# Patient Record
Sex: Female | Born: 1970 | Race: White | Hispanic: Yes | Marital: Married | Smoking: Never smoker
Health system: Southern US, Community
[De-identification: ages and names within clinical notes are randomized; demographics above are authoritative.]

## PROBLEM LIST (undated history)

## (undated) DIAGNOSIS — J45909 Unspecified asthma, uncomplicated: Secondary | ICD-10-CM

## (undated) HISTORY — DX: Unspecified asthma, uncomplicated: J45.909

---

## 1999-04-06 ENCOUNTER — Other Ambulatory Visit: Admission: RE | Admit: 1999-04-06 | Discharge: 1999-04-06 | Payer: Self-pay | Admitting: *Deleted

## 2000-08-19 ENCOUNTER — Emergency Department (HOSPITAL_COMMUNITY): Admission: EM | Admit: 2000-08-19 | Discharge: 2000-08-20 | Payer: Self-pay | Admitting: Emergency Medicine

## 2000-08-30 ENCOUNTER — Emergency Department (HOSPITAL_COMMUNITY): Admission: EM | Admit: 2000-08-30 | Discharge: 2000-08-30 | Payer: Self-pay | Admitting: Emergency Medicine

## 2000-10-06 ENCOUNTER — Other Ambulatory Visit: Admission: RE | Admit: 2000-10-06 | Discharge: 2000-10-06 | Payer: Self-pay | Admitting: *Deleted

## 2001-12-20 ENCOUNTER — Other Ambulatory Visit: Admission: RE | Admit: 2001-12-20 | Discharge: 2001-12-20 | Payer: Self-pay | Admitting: Obstetrics & Gynecology

## 2003-01-01 ENCOUNTER — Other Ambulatory Visit: Admission: RE | Admit: 2003-01-01 | Discharge: 2003-01-01 | Payer: Self-pay | Admitting: Obstetrics & Gynecology

## 2003-01-04 ENCOUNTER — Ambulatory Visit (HOSPITAL_COMMUNITY): Admission: RE | Admit: 2003-01-04 | Discharge: 2003-01-04 | Payer: Self-pay | Admitting: Obstetrics & Gynecology

## 2012-09-07 ENCOUNTER — Other Ambulatory Visit: Payer: Self-pay

## 2012-09-07 DIAGNOSIS — Z1231 Encounter for screening mammogram for malignant neoplasm of breast: Secondary | ICD-10-CM

## 2012-09-22 ENCOUNTER — Ambulatory Visit
Admission: RE | Admit: 2012-09-22 | Discharge: 2012-09-22 | Disposition: A | Discharge status: Home / Self Care | Payer: 59 | Source: Ambulatory Visit

## 2012-09-22 DIAGNOSIS — Z1231 Encounter for screening mammogram for malignant neoplasm of breast: Secondary | ICD-10-CM

## 2012-09-26 ENCOUNTER — Other Ambulatory Visit: Payer: Self-pay | Admitting: Obstetrics & Gynecology

## 2012-09-26 DIAGNOSIS — R928 Other abnormal and inconclusive findings on diagnostic imaging of breast: Secondary | ICD-10-CM

## 2012-11-01 ENCOUNTER — Ambulatory Visit
Admission: RE | Admit: 2012-11-01 | Discharge: 2012-11-01 | Disposition: A | Discharge status: Home / Self Care | Payer: 59 | Source: Ambulatory Visit | Attending: Obstetrics & Gynecology | Admitting: Obstetrics & Gynecology

## 2012-11-01 DIAGNOSIS — R928 Other abnormal and inconclusive findings on diagnostic imaging of breast: Secondary | ICD-10-CM

## 2014-12-10 ENCOUNTER — Encounter: Payer: Self-pay | Admitting: Obstetrics & Gynecology

## 2018-10-12 ENCOUNTER — Other Ambulatory Visit: Payer: Self-pay | Admitting: Obstetrics & Gynecology

## 2018-10-12 ENCOUNTER — Ambulatory Visit: Payer: BC Managed Care – PPO | Admitting: Obstetrics & Gynecology

## 2018-10-12 ENCOUNTER — Encounter: Payer: Self-pay | Admitting: Obstetrics & Gynecology

## 2018-10-12 ENCOUNTER — Other Ambulatory Visit: Payer: Self-pay

## 2018-10-12 VITALS — BP 124/82 | Ht 62.0 in | Wt 198.0 lb

## 2018-10-12 DIAGNOSIS — Z01419 Encounter for gynecological examination (general) (routine) without abnormal findings: Secondary | ICD-10-CM

## 2018-10-12 DIAGNOSIS — Z3009 Encounter for other general counseling and advice on contraception: Secondary | ICD-10-CM

## 2018-10-12 DIAGNOSIS — Z1231 Encounter for screening mammogram for malignant neoplasm of breast: Secondary | ICD-10-CM

## 2018-10-12 DIAGNOSIS — E6609 Other obesity due to excess calories: Secondary | ICD-10-CM

## 2018-10-12 DIAGNOSIS — Z6836 Body mass index (BMI) 36.0-36.9, adult: Secondary | ICD-10-CM

## 2018-10-12 DIAGNOSIS — Z23 Encounter for immunization: Secondary | ICD-10-CM

## 2018-10-12 NOTE — Progress Notes (Signed)
Laura Mcmahon January 11, 1971 [id number]   History:    48 y.o. 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  RP:  New (>3 yrs) patient presenting for annual gyn exam   HPI: Menses regular normal every month.  No BTB.  No pelvic pain.  No IC as husband has erectile problems, declines contraception.  Urine/BMs normal.  Breasts normal.  BMI 36.21.  Exercising with husband regularly.  Will decrease calories in diet.  Health labs with Dr Dagmar Hait.  Past medical history,surgical history, family history and social history were all reviewed and documented in the EPIC chart.  Gynecologic History Patient's last menstrual period was 10/04/2018. Contraception: abstinence Last Pap: 12/2014. Results were: Negative Last mammogram: 12/2014. Results were: Negative Bone Density: Never Colonoscopy: Never  Obstetric History OB History  Gravida Para Term Preterm AB Living  2 2       2   SAB TAB Ectopic Multiple Live Births        1      # Outcome Date GA Lbr Len/2nd Weight Sex Delivery Anes PTL Lv  2 Para           1 Para              ROS: A ROS was performed and pertinent positives and negatives are included in the history.  GENERAL: No fevers or chills. HEENT: No change in vision, no earache, sore throat or sinus congestion. NECK: No pain or stiffness. CARDIOVASCULAR: No chest pain or pressure. No palpitations. PULMONARY: No shortness of breath, cough or wheeze. GASTROINTESTINAL: No abdominal pain, nausea, vomiting or diarrhea, melena or bright red blood per rectum. GENITOURINARY: No urinary frequency, urgency, hesitancy or dysuria. MUSCULOSKELETAL: No joint or muscle pain, no back pain, no recent trauma. DERMATOLOGIC: No rash, no itching, no lesions. ENDOCRINE: No polyuria, polydipsia, no heat or cold intolerance. No recent change in weight. HEMATOLOGICAL: No anemia or easy bruising or bleeding. NEUROLOGIC: No headache, seizures, numbness, tingling or weakness. PSYCHIATRIC: No  depression, no loss of interest in normal activity or change in sleep pattern.     Exam:   BP 124/82   Ht 5\' 2"  (1.575 m)   Wt 198 lb (89.8 kg)   LMP 10/04/2018   BMI 36.21 kg/m   Body mass index is 36.21 kg/m.  General appearance : Well developed well nourished female. No acute distress HEENT: Eyes: no retinal hemorrhage or exudates,  Neck supple, trachea midline, no carotid bruits, no thyroidmegaly Lungs: Clear to auscultation, no rhonchi or wheezes, or rib retractions  Heart: Regular rate and rhythm, no murmurs or gallops Breast:Examined in sitting and supine position were symmetrical in appearance, no palpable masses or tenderness,  no skin retraction, no nipple inversion, no nipple discharge, no skin discoloration, no axillary or supraclavicular lymphadenopathy Abdomen: no palpable masses or tenderness, no rebound or guarding Extremities: no edema or skin discoloration or tenderness  Pelvic: Vulva: Normal             Vagina: No gross lesions or discharge  Cervix: No gross lesions or discharge.  Pap reflex done.  Uterus  AV, normal size, shape and consistency, non-tender and mobile  Adnexa  Without masses or tenderness  Anus: Normal   Assessment/Plan:  48 y.o. female for annual exam   1. Encounter for routine gynecological examination with Papanicolaou smear of cervix Normal gynecologic exam.  Pap reflex done.  Breast exam normal.  Screening mammogram to schedule now.  Health labs with Dr.  Avva.  2. Encounter for other general counseling or advice on contraception Declines contraception, rarely sexually active.  Condoms as needed.  3. Class 2 obesity due to excess calories without serious comorbidity with body mass index (BMI) of 36.0 to 36.9 in adult Recommend a lower calorie/carb diet such as [employer].  Aerobic physical activities 5 times a week and weightlifting every 2 days.  Other orders - Multiple Vitamin (MULTIVITAMIN) tablet; Take 1 tablet by mouth  daily. - Flu Vaccine QUAD 36+ mos IM (Fluarix, Quad PF)  [name] MD, 4:07 PM 10/12/2018

## 2018-10-13 LAB — PAP IG W/ RFLX HPV ASCU

## 2018-10-13 NOTE — Patient Instructions (Signed)
  1. Encounter for routine gynecological examination with Papanicolaou smear of cervix Normal gynecologic exam.  Pap reflex done.  Breast exam normal.  Screening mammogram to schedule now.  Health labs with Dr. Dagmar Hait.  2. Encounter for other general counseling or advice on contraception Declines contraception, rarely sexually active.  Condoms as needed.  3. Class 2 obesity due to excess calories without serious comorbidity with body mass index (BMI) of 36.0 to 36.9 in adult Recommend a lower calorie/carb diet such as [employer].  Aerobic physical activities 5 times a week and weightlifting every 2 days.  Other orders - Multiple Vitamin (MULTIVITAMIN) tablet; Take 1 tablet by mouth daily. - Flu Vaccine QUAD 36+ mos IM (Fluarix, Quad PF)  Verdis Frederickson, it was a pleasure seeing you today!  I will inform you of your results as soon as they are available.

## 2018-11-24 ENCOUNTER — Other Ambulatory Visit: Payer: Self-pay

## 2018-11-24 ENCOUNTER — Ambulatory Visit
Admission: RE | Admit: 2018-11-24 | Discharge: 2018-11-24 | Disposition: A | Discharge status: Home / Self Care | Payer: BC Managed Care – PPO | Source: Ambulatory Visit | Attending: Obstetrics & Gynecology | Admitting: Obstetrics & Gynecology

## 2018-11-24 DIAGNOSIS — Z1231 Encounter for screening mammogram for malignant neoplasm of breast: Secondary | ICD-10-CM

## 2019-10-16 ENCOUNTER — Other Ambulatory Visit: Payer: Self-pay

## 2019-10-16 ENCOUNTER — Ambulatory Visit (INDEPENDENT_AMBULATORY_CARE_PROVIDER_SITE_OTHER): Payer: BC Managed Care – PPO | Admitting: Obstetrics & Gynecology

## 2019-10-16 ENCOUNTER — Encounter: Payer: Self-pay | Admitting: Obstetrics & Gynecology

## 2019-10-16 VITALS — BP 126/84 | Ht 62.0 in | Wt 202.0 lb

## 2019-10-16 DIAGNOSIS — Z780 Asymptomatic menopausal state: Secondary | ICD-10-CM

## 2019-10-16 DIAGNOSIS — Z6836 Body mass index (BMI) 36.0-36.9, adult: Secondary | ICD-10-CM

## 2019-10-16 DIAGNOSIS — E6609 Other obesity due to excess calories: Secondary | ICD-10-CM

## 2019-10-16 DIAGNOSIS — Z01419 Encounter for gynecological examination (general) (routine) without abnormal findings: Secondary | ICD-10-CM | POA: Diagnosis not present

## 2019-10-16 DIAGNOSIS — Z3009 Encounter for other general counseling and advice on contraception: Secondary | ICD-10-CM | POA: Diagnosis not present

## 2019-10-16 NOTE — Progress Notes (Signed)
Laura Mcmahon 1970-09-05 [id number]   History:    49 y.o.  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  RP:  Established patient presenting for annual gyn exam   HPI:  Menopause with no menses x 03/2019.  Mild hot flushes.  Declines HRT at this time.  No PMB.  No pelvic pain.  No IC as husband has erectile problems, declines contraception.  Urine/BMs normal.  Breasts normal.  BMI 36.95.  Will decrease calories in diet.  Planning to resume physical activities.  Health labs with Dr Dagmar Hait.  Past medical history,surgical history, family history and social history were all reviewed and documented in the EPIC chart.  Gynecologic History Patient's last menstrual period was 03/18/2019.  Obstetric History OB History  Gravida Para Term Preterm AB Living  1 1       2   SAB TAB Ectopic Multiple Live Births        1      # Outcome Date GA Lbr Len/2nd Weight Sex Delivery Anes PTL Lv  1 Para              ROS: A ROS was performed and pertinent positives and negatives are included in the history.  GENERAL: No fevers or chills. HEENT: No change in vision, no earache, sore throat or sinus congestion. NECK: No pain or stiffness. CARDIOVASCULAR: No chest pain or pressure. No palpitations. PULMONARY: No shortness of breath, cough or wheeze. GASTROINTESTINAL: No abdominal pain, nausea, vomiting or diarrhea, melena or bright red blood per rectum. GENITOURINARY: No urinary frequency, urgency, hesitancy or dysuria. MUSCULOSKELETAL: No joint or muscle pain, no back pain, no recent trauma. DERMATOLOGIC: No rash, no itching, no lesions. ENDOCRINE: No polyuria, polydipsia, no heat or cold intolerance. No recent change in weight. HEMATOLOGICAL: No anemia or easy bruising or bleeding. NEUROLOGIC: No headache, seizures, numbness, tingling or weakness. PSYCHIATRIC: No depression, no loss of interest in normal activity or change in sleep pattern.     Exam:   BP 126/84   Ht 5\' 2"  (1.575  m)   Wt 202 lb (91.6 kg)   LMP 03/18/2019   BMI 36.95 kg/m   Body mass index is 36.95 kg/m.  General appearance : Well developed well nourished female. No acute distress HEENT: Eyes: no retinal hemorrhage or exudates,  Neck supple, trachea midline, no carotid bruits, no thyroidmegaly Lungs: Clear to auscultation, no rhonchi or wheezes, or rib retractions  Heart: Regular rate and rhythm, no murmurs or gallops Breast:Examined in sitting and supine position were symmetrical in appearance, no palpable masses or tenderness,  no skin retraction, no nipple inversion, no nipple discharge, no skin discoloration, no axillary or supraclavicular lymphadenopathy Abdomen: no palpable masses or tenderness, no rebound or guarding Extremities: no edema or skin discoloration or tenderness  Pelvic: Vulva: Normal             Vagina: No gross lesions or discharge  Cervix: No gross lesions or discharge.  Pap reflex done.  Uterus AV, normal size, shape and consistency, non-tender and mobile  Adnexa  Without masses or tenderness  Anus: Normal   Assessment/Plan:  49 y.o. female for annual exam   1. Encounter for routine gynecological examination with Papanicolaou smear of cervix Normal gynecologic exam.  Pap reflex done.  Breast exam normal.  Screening mammogram October 2020 was negative.  Has an appointment with gastro in November 2021 for screening colonoscopy.  Health labs with Dr. Dagmar Hait.  2. Menopause No menses since  February 2021.  Mild hot flushes.  Will call to organize a visit if wants to start HRT.  Vit D supplements, Ca++ 1200-1500 mg daily, regular weight bearing physical activities recommended.  3. Encounter for other general counseling or advice on contraception Abstinent.  4. Class 2 obesity due to excess calories without serious comorbidity with body mass index (BMI) of 36.0 to 36.9 in adult Low Calorie/Carb diet.  Aerobic activities 5 times a week, light weight lifting every 2  days.  Other orders - Ascorbic Acid (VITAMIN C) 100 MG tablet; Take 100 mg by mouth daily.  [name] MD, 4:24 PM 10/16/2019

## 2019-10-17 NOTE — Addendum Note (Signed)
Addended by: Thurnell Garbe A on: 10/17/2019 08:08 AM   Modules accepted: Orders

## 2019-10-18 LAB — PAP IG W/ RFLX HPV ASCU

## 2020-03-10 ENCOUNTER — Telehealth: Payer: Self-pay

## 2020-03-10 NOTE — Telephone Encounter (Signed)
Called in voice mail stating it has been one year since LMP.  Started bleeding 4 weeks ago and continues.  I asked the appointment desk to call her and schedule visit with Dr. Marguerita Merles as soon as possible.

## 2020-03-12 ENCOUNTER — Ambulatory Visit: Payer: BC Managed Care – PPO | Admitting: Obstetrics & Gynecology

## 2020-04-09 ENCOUNTER — Ambulatory Visit: Payer: BC Managed Care – PPO | Admitting: Obstetrics & Gynecology

## 2020-04-09 ENCOUNTER — Other Ambulatory Visit: Payer: Self-pay

## 2020-04-09 ENCOUNTER — Encounter: Payer: Self-pay | Admitting: Obstetrics & Gynecology

## 2020-04-09 VITALS — BP 124/86

## 2020-04-09 DIAGNOSIS — N950 Postmenopausal bleeding: Secondary | ICD-10-CM

## 2020-04-09 NOTE — Progress Notes (Signed)
    Laura Mcmahon Nov 15, 1970 [id number]        49 y.o.  G1P1L2  RP: Menometrorrhagia x 01/2020  HPI: Menometrorrhagia from last week of 01/2020 to first week of 03/2020.  Mild cramping while bleeding.  No abnormal vaginal discharge.  No UTI Sx.  BMs normal.  No fever.   OB History  Gravida Para Term Preterm AB Living  1 1       2   SAB IAB Ectopic Multiple Live Births        1      # Outcome Date GA Lbr Len/2nd Weight Sex Delivery Anes PTL Lv  1 Para             Past medical history,surgical history, problem list, medications, allergies, family history and social history were all reviewed and documented in the EPIC chart.   Directed ROS with pertinent positives and negatives documented in the history of present illness/assessment and plan.  Exam:  Vitals:   04/09/20 1503  BP: 124/86   General appearance:  Normal  Abdomen: Normal  Gynecologic exam: Vulva normal.  Speculum:  Cervix/Vagina normal.  No blood.  Bimanual exam:  AV uterus, normal volume, NT, mobile.  No adnexal mass, NT.   Assessment/Plan:  50 y.o. G1P1   1. Postmenopausal bleeding Prolonged PMB.  Normal gynecologic exam today.  Will further investigate with a Pelvic US at f/u.  - US Transvaginal Non-OB; Future  Other orders - Fexofenadine HCl (ALLERGY 24-HR PO); Take by mouth.  [name] MD, 3:31 PM 04/09/2020

## 2020-04-14 ENCOUNTER — Encounter: Payer: Self-pay | Admitting: Obstetrics & Gynecology

## 2020-05-15 ENCOUNTER — Encounter: Payer: Self-pay | Admitting: Obstetrics & Gynecology

## 2020-05-15 ENCOUNTER — Other Ambulatory Visit: Payer: Self-pay

## 2020-05-15 ENCOUNTER — Ambulatory Visit (INDEPENDENT_AMBULATORY_CARE_PROVIDER_SITE_OTHER): Payer: BC Managed Care – PPO

## 2020-05-15 ENCOUNTER — Ambulatory Visit: Payer: BC Managed Care – PPO | Admitting: Obstetrics & Gynecology

## 2020-05-15 VITALS — BP 128/72 | HR 82

## 2020-05-15 DIAGNOSIS — D250 Submucous leiomyoma of uterus: Secondary | ICD-10-CM

## 2020-05-15 DIAGNOSIS — N950 Postmenopausal bleeding: Secondary | ICD-10-CM | POA: Diagnosis not present

## 2020-05-15 DIAGNOSIS — R9389 Abnormal findings on diagnostic imaging of other specified body structures: Secondary | ICD-10-CM | POA: Diagnosis not present

## 2020-05-15 NOTE — Progress Notes (Signed)
    Laura Mcmahon 1970/07/01 [id number]        50 y.o.  G1P1   RP: PMB for Pelvic US  HPI: Menometrorrhagia from last week of 01/2020 to first week of 03/2020.  Mild cramping while bleeding.  No abnormal vaginal discharge.  No UTI Sx.  BMs normal.  No fever.   OB History  Gravida Para Term Preterm AB Living  1 1       2   SAB IAB Ectopic Multiple Live Births        1      # Outcome Date GA Lbr Len/2nd Weight Sex Delivery Anes PTL Lv  1 Para             Past medical history,surgical history, problem list, medications, allergies, family history and social history were all reviewed and documented in the EPIC chart.   Directed ROS with pertinent positives and negatives documented in the history of present illness/assessment and plan.  Exam:  Vitals:   05/15/20 1203  BP: 128/72  Pulse: 82   General appearance:  Normal  Pelvic US today: T/V images.  Retroverted uterus normal in size and shape measured at 8.52 x 4.87 x 4.01 cm.  Small amount of fluid at the cervix with debris's.  Myometrial mass measured at 1.7 x 1.3 cm compatible with an intramural fibroid.  Thickened endometrium with probable encroachment of the small fibroid.  Scant free fluid in the endometrial cavity.  The endometrial lining is measured at 6.48 mm.  Both ovaries are normal in size with normal follicular pattern.  No adnexal mass.  No free fluid in the posterior cul-de-sac.   Assessment/Plan:  50 y.o. G1P1   1. Postmenopausal bleeding Pelvic ultrasound findings thoroughly reviewed with patient.  Mildly thickened endometrial lining at 6.48 mm.  Probable submucosal component of the small fibroid.  Decision to proceed with a sonohysterogram.  2. Thickened endometrium As above.  Follow-up sonohysterogram. - [place] Sonohysterogram; Future  3. Submucous uterine fibroid As above. - [place] Sonohysterogram; Future  [name] MD, 12:08 PM 05/15/2020

## 2020-06-17 ENCOUNTER — Other Ambulatory Visit (HOSPITAL_COMMUNITY)
Admission: RE | Admit: 2020-06-17 | Discharge: 2020-06-17 | Disposition: A | Discharge status: Home / Self Care | Payer: BC Managed Care – PPO | Source: Ambulatory Visit | Attending: Obstetrics & Gynecology | Admitting: Obstetrics & Gynecology

## 2020-06-17 ENCOUNTER — Ambulatory Visit: Payer: BC Managed Care – PPO | Admitting: Obstetrics & Gynecology

## 2020-06-17 ENCOUNTER — Ambulatory Visit (INDEPENDENT_AMBULATORY_CARE_PROVIDER_SITE_OTHER): Payer: BC Managed Care – PPO

## 2020-06-17 ENCOUNTER — Other Ambulatory Visit: Payer: Self-pay

## 2020-06-17 DIAGNOSIS — D250 Submucous leiomyoma of uterus: Secondary | ICD-10-CM | POA: Diagnosis not present

## 2020-06-17 DIAGNOSIS — R9389 Abnormal findings on diagnostic imaging of other specified body structures: Secondary | ICD-10-CM

## 2020-06-17 DIAGNOSIS — N950 Postmenopausal bleeding: Secondary | ICD-10-CM | POA: Diagnosis not present

## 2020-06-17 DIAGNOSIS — D251 Intramural leiomyoma of uterus: Secondary | ICD-10-CM | POA: Diagnosis not present

## 2020-06-17 MED ORDER — MEDROXYPROGESTERONE ACETATE 10 MG PO TABS: 10.0000 mg | ORAL_TABLET | Freq: Every day | ORAL | 4 refills | Status: DC

## 2020-06-17 NOTE — Progress Notes (Signed)
Laura Mcmahon Jun 18, 1970 [id number]        49 y.o.  G1P1   RP: Post menopausal bleeding with a thickened Endometrium for Sonohysterogram  HPI: Menometrorrhagia from 01/2020 to first week of 03/2020.  Had other episodes since then, but no current bleeding.Mild cramping while bleeding.  Pelvic US 05/15/2020 showed a thickened endometrium at 6.48 mm with a probable SM component of the Fibroid. No abnormal vaginal discharge. No UTI Sx. BMs normal. No fever.    OB History  Gravida Para Term Preterm AB Living  1 1       2   SAB IAB Ectopic Multiple Live Births        1      # Outcome Date GA Lbr Len/2nd Weight Sex Delivery Anes PTL Lv  1 Para             Past medical history,surgical history, problem list, medications, allergies, family history and social history were all reviewed and documented in the EPIC chart.   Directed ROS with pertinent positives and negatives documented in the history of present illness/assessment and plan.  Exam:  There were no vitals filed for this visit. General appearance:  Normal                                                                    Sono Infusion Hysterogram ( procedure note)   The initial transvaginal ultrasound demonstrated the following:  T/V images.  No latex allergy.  Anteverted uteruS with a 2.4 cm posterior intramural fibroid.  Small amount of fluid still seen in the endocervical canal.  The endometrial cavity is slightly fluid-filled prior to the sonohysterogram.  Both ovaries are normal in size with normal follicular pattern.  No adnexal mass.  No free fluid in the posterior cul-de-sac.  The speculum  was inserted and the cervix cleansed with Betadine solution after confirming that patient has no allergies.A small sonohysterography catheterwas utilized.  Insertion was facilitated with ring forceps, using a spear-like motion the catheter was inserted to the fundus of the uterus. The speculum is then removed carefully to  avoid dislodging the catheter. The catheter was flushed with sterile saline delete prior to insertion to rid it of small amounts of air.the sterile saline solution was infused into the uterine cavity as a vaginal ultrasound probe was then placed in the vagina for full visualization of the uterine cavity from a transvaginal approach. The following was noted:  The endometrial lining is measured at 8.13 mm.  No intra uterine lesion seen.  The posterior fibroid is intramural with no submucosal component.  An endometrial biopsy is done on all intra uterine surfaces before removing the cannula.  The specimen is sent to pathology.  The catheter was then removed after retrieving some of the saline from the intrauterine cavity. An endometrial biopsy was done on all endometrial surfaces before removing the intra-uterine canula. Patient tolerated procedure well. She had received a tablet of Aleve for discomfort.    Assessment/Plan:  50 y.o. G1P1   1. Postmenopausal bleeding Ultrasound and sono hysterogram findings thoroughly reviewed with patient.  The intra uterine cavity is normal with no submucosal fibroid or polyp, but the endometrial lining is mildly thickened at 8.13 mm.  Therefore the  decision was made to proceed with an endometrial biopsy.  Informed consent was signed before doing so and no complication occurred.  We will manage per endometrial biopsy results.  Verify menopausal status with an [facility] today.  As a precaution, decision to start on Provera 10 mg/tab 1 tablet daily for 10 days cyclically to produce a withdrawal bleeding at least every 3 months.  Benefits, risks and usage thoroughly reviewed with patient.  Prescription sent to pharmacy. [facility] - Surgical pathology( [place]/ [facility])  2. Thickened endometrium Verify menopausal status with an [facility] today.  Pending endometrial biopsy to rule out endometrial pathology such as hyperplasia and endometrial cancer. [facility] - Surgical pathology( [facility])  3. Intramural uterine fibroid Intramural fibroids with no submucosal component.  Other orders - medroxyPROGESTERone (PROVERA) 10 MG tablet; Take 1 tablet (10 mg total) by mouth daily for 10 days. Cyclic Provera x 10 days every month if no spontaneous menstrual period.  [name] MD, 4:09 PM 06/17/2020

## 2020-06-18 ENCOUNTER — Encounter: Payer: Self-pay | Admitting: Obstetrics & Gynecology

## 2020-06-19 LAB — SURGICAL PATHOLOGY

## 2020-06-23 ENCOUNTER — Encounter: Payer: Self-pay | Admitting: Anesthesiology

## 2020-10-16 ENCOUNTER — Other Ambulatory Visit: Payer: Self-pay

## 2020-10-16 ENCOUNTER — Encounter: Payer: Self-pay | Admitting: Obstetrics & Gynecology

## 2020-10-16 ENCOUNTER — Ambulatory Visit (INDEPENDENT_AMBULATORY_CARE_PROVIDER_SITE_OTHER): Payer: BC Managed Care – PPO | Admitting: Obstetrics & Gynecology

## 2020-10-16 VITALS — BP 114/70 | HR 70 | Resp 16 | Ht 62.25 in | Wt 200.0 lb

## 2020-10-16 DIAGNOSIS — Z01419 Encounter for gynecological examination (general) (routine) without abnormal findings: Secondary | ICD-10-CM | POA: Diagnosis not present

## 2020-10-16 DIAGNOSIS — N951 Menopausal and female climacteric states: Secondary | ICD-10-CM | POA: Diagnosis not present

## 2020-10-16 DIAGNOSIS — E6609 Other obesity due to excess calories: Secondary | ICD-10-CM | POA: Diagnosis not present

## 2020-10-16 DIAGNOSIS — Z6836 Body mass index (BMI) 36.0-36.9, adult: Secondary | ICD-10-CM | POA: Diagnosis not present

## 2020-10-16 DIAGNOSIS — E66812 Obesity, class 2: Secondary | ICD-10-CM

## 2020-10-16 MED ORDER — MEDROXYPROGESTERONE ACETATE 10 MG PO TABS: 10.0000 mg | ORAL_TABLET | Freq: Every day | ORAL | 4 refills | Status: DC

## 2020-10-16 NOTE — Progress Notes (Signed)
Laura Mcmahon [birth year] [id number]   History:    50 y.o. 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   RP:  Established patient presenting for annual gyn exam    HPI:  Perimenopause. Mild hot flushes.  Has monthly withdrawal bleeding on cyclic Provera.  EBx Benign in 06/2020.  No pelvic pain.  No IC as husband has erectile problems, declines contraception.  Urine/BMs normal.  Breasts normal.  BMI 36.29.  Will decrease calories in diet.  Planning to resume physical activities.  Health labs with Dr Dagmar Hait.  Under investigation for a dry cough.    Past medical history,surgical history, family history and social history were all reviewed and documented in the EPIC chart.  Gynecologic History No LMP recorded (exact date). Patient is postmenopausal.  Obstetric History OB History  Gravida Para Term Preterm AB Living  '1 1       2  '$ SAB IAB Ectopic Multiple Live Births        1      # Outcome Date GA Lbr Len/2nd Weight Sex Delivery Anes PTL Lv  1 Para              ROS: A ROS was performed and pertinent positives and negatives are included in the history.  GENERAL: No fevers or chills. HEENT: No change in vision, no earache, sore throat or sinus congestion. NECK: No pain or stiffness. CARDIOVASCULAR: No chest pain or pressure. No palpitations. PULMONARY: No shortness of breath, cough or wheeze. GASTROINTESTINAL: No abdominal pain, nausea, vomiting or diarrhea, melena or bright red blood per rectum. GENITOURINARY: No urinary frequency, urgency, hesitancy or dysuria. MUSCULOSKELETAL: No joint or muscle pain, no back pain, no recent trauma. DERMATOLOGIC: No rash, no itching, no lesions. ENDOCRINE: No polyuria, polydipsia, no heat or cold intolerance. No recent change in weight. HEMATOLOGICAL: No anemia or easy bruising or bleeding. NEUROLOGIC: No headache, seizures, numbness, tingling or weakness. PSYCHIATRIC: No depression, no loss of interest in normal activity or change  in sleep pattern.     Exam:   BP 114/70   Pulse 70   Resp 16   Ht 5' 2.25" (1.581 m)   Wt 200 lb (90.7 kg)   LMP  (Exact Date) Comment: bleeding 08/2020  BMI 36.29 kg/m   Body mass index is 36.29 kg/m.  General appearance : Well developed well nourished female. No acute distress HEENT: Eyes: no retinal hemorrhage or exudates,  Neck supple, trachea midline, no carotid bruits, no thyroidmegaly Lungs: Clear to auscultation, no rhonchi or wheezes, or rib retractions  Heart: Regular rate and rhythm, no murmurs or gallops Breast:Examined in sitting and supine position were symmetrical in appearance, no palpable masses or tenderness,  no skin retraction, no nipple inversion, no nipple discharge, no skin discoloration, no axillary or supraclavicular lymphadenopathy Abdomen: no palpable masses or tenderness, no rebound or guarding Extremities: no edema or skin discoloration or tenderness  Pelvic: Vulva: Normal             Vagina: No gross lesions or discharge  Cervix: No gross lesions or discharge  Uterus  AV, normal size, shape and consistency, non-tender and mobile  Adnexa  Without masses or tenderness  Anus: Normal   Assessment/Plan:  50 y.o. female for annual exam   1. Well female exam with routine gynecological exam Normal gynecologic exam.  Pap reflex was - September 2021, will repeat at 2 to 3 years.  Breast exam normal.  Patient will schedule a  screening mammogram now.  Schedule colonoscopy.  Health labs with Dr. Dagmar Hait.  Cough under investigation.  2. Perimenopause Well on cyclic Provera 10 mg for 10 days every months.  Patient is having a light withdrawal bleeding post Provera every month.  Provera represcribed.  3. Class 2 obesity due to excess calories without serious comorbidity with body mass index (BMI) of 36.0 to 36.9 in adult Recommend a lower calorie/carb diet.  Recommend aerobic activities 5 times a week and light weightlifting every 2 days after investigating and  treating her new dry cough.  Other orders - albuterol (PROVENTIL) (2.5 MG/3ML) 0.083% nebulizer solution; SMARTSIG:1 Vial(s) Via Nebulizer Every 6-8 Hours PRN - albuterol (VENTOLIN HFA) 108 (90 Base) MCG/ACT inhaler; 2 puff as needed - famotidine (PEPCID) 20 MG tablet; Take 20 mg by mouth at bedtime as needed. - TRELEGY ELLIPTA 200-62.5-25 MCG/INH AEPB; Inhale 1 puff into the lungs daily. - montelukast (SINGULAIR) 10 MG tablet; Take 10 mg by mouth daily. - medroxyPROGESTERone (PROVERA) 10 MG tablet; Take 1 tablet (10 mg total) by mouth daily for 10 days. Cyclic Provera x 10 days every month if no spontaneous menstrual period.   [name] MD, 4:15 PM 10/16/2020

## 2020-10-29 NOTE — Progress Notes (Signed)
Synopsis: Referred for cough,, asthma by Prince Solian, MD  Subjective:   PATIENT ID: Laura Mcmahon GENDER: female DOB: Jun 28, 1970, MRN: [id number]  Chief Complaint  Patient presents with   Consult    Patient reports that she has some dry cough and she will feel the cough coming on at times.     89yF with history of asthma who is referred for cough, asthma. Had covid-19 in 03/2020.   She says that in late April/May had cough. Dry. Tried GERD medication (she can't remember which) for 30 days ( pepcid in chart). No difference. Then developed wheezing. No chest tightness. While cough is dry, feels like there is something moving up and down back of throat. She has no PND. She sometimes/pretty often has sinonasal congestion. She does think singulair has improved her sinonasal congestion.   Cough stopped in July and then restarted in August.   Did get prescription for steroid in June - this was very helpful for cough, trelegy has been helpful as well.  Otherwise pertinent review of systems is negative.  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 Hard for her to say whther her symptoms are worse at work. No pets or hot tub at home.   No past medical history on file.   Family History  Problem Relation Age of Onset   Asthma Neg Hx    Colon cancer Neg Hx    COPD Neg Hx    Deep vein thrombosis Neg Hx    Cystic fibrosis Neg Hx    Diabetes Neg Hx    Tuberous sclerosis Neg Hx    Prostate cancer Neg Hx    Neurofibromatosis Neg Hx      Past Surgical History:  Procedure Laterality Date   CESAREAN SECTION N/A 02/08/2006    Social History   Socioeconomic History   Marital status: Married    Spouse name: Not on file   Number of children: Not on file   Years of education: Not on file   Highest education level: Not on file  Occupational History   Not on file  Tobacco Use    Smoking status: Never   Smokeless tobacco: Never  Vaping Use   Vaping Use: Never used  Substance and Sexual Activity   Alcohol use: Not Currently   Drug use: Never   Sexual activity: Not Currently    Partners: Male    Birth control/protection: Post-menopausal  Other Topics Concern   Not on file  Social History Narrative   Not on file   Social Determinants of Health   Financial Resource Strain: Not on file  Food Insecurity: Not on file  Transportation Needs: Not on file  Physical Activity: Not on file  Stress: Not on file  Social Connections: Not on file  Intimate Partner Violence: Not on file     No Known Allergies   Outpatient Medications Prior to Visit  Medication Sig Dispense Refill   albuterol (PROVENTIL) (2.5 MG/3ML) 0.083% nebulizer solution SMARTSIG:1 Vial(s) Via Nebulizer Every 6-8 Hours PRN     albuterol (VENTOLIN HFA) 108 (90 Base) MCG/ACT inhaler 2 puff as needed     Ascorbic Acid (VITAMIN C) 100 MG tablet Take 100 mg by mouth daily.     famotidine (PEPCID) 20 MG tablet Take 20 mg by mouth at bedtime as needed.     medroxyPROGESTERone (PROVERA) 10 MG tablet Take 1 tablet (10  mg total) by mouth daily for 10 days. Cyclic Provera x 10 days every month if no spontaneous menstrual period. 30 tablet 4   montelukast (SINGULAIR) 10 MG tablet Take 10 mg by mouth daily.     Multiple Vitamin (MULTIVITAMIN) tablet Take 1 tablet by mouth daily.     TRELEGY ELLIPTA 200-62.5-25 MCG/INH AEPB Inhale 1 puff into the lungs daily.     No facility-administered medications prior to visit.       Objective:   Physical Exam:  General appearance: 50 y.o., female, NAD, conversant  Eyes: anicteric sclerae, moist conjunctivae; no lid-lag; PERRL, tracking appropriately HENT: NCAT; oropharynx, MMM, no mucosal ulcerations; normal hard and soft palate Neck: Trachea midline; no lymphadenopathy, no JVD Lungs: CTAB, no crackles, no wheeze, with normal respiratory effort CV: RRR, no MRGs   Abdomen: Soft, non-tender; non-distended, BS present  Extremities: No peripheral edema, radial and DP pulses present bilaterally  Skin: Normal temperature, turgor and texture; no rash Psych: Appropriate affect Neuro: Alert and oriented to person and place, no focal deficit    Vitals:   10/30/20 1533  BP: 110/72  Pulse: 85  Temp: 97.8 F (36.6 C)  TempSrc: Oral  SpO2: 97%  Weight: 201 lb 6.4 oz (91.4 kg)  Height: 5' 2.25" (1.581 m)   97% on RA BMI Readings from Last 3 Encounters:  10/30/20 36.54 kg/m  10/16/20 36.29 kg/m  10/16/19 36.95 kg/m   Wt Readings from Last 3 Encounters:  10/30/20 201 lb 6.4 oz (91.4 kg)  10/16/20 200 lb (90.7 kg)  10/16/19 202 lb (91.6 kg)     CBC No results found for: WBC, RBC, HGB, HCT, PLT, MCV, MCH, MCHC, RDW, LYMPHSABS, MONOABS, EOSABS, BASOSABS  None available  Chest Imaging: No cxr available for review - per pt's report nothing out of the ordinary this summer  Pulmonary Functions Testing Results: No flowsheet data found.      Assessment & Plan:   # Chronic cough Steroid responsiveness and good symptomatic response to trelegy increase suspicion for asthma. No overt GERD symptoms but only had trial of H2B in past it sounds like. No significant nasal congestion/PND at present with singulair. No throat tightness/hoarseness suggestive of VCD.  Plan: - obtain OSH CXR from this summer - cbc/diff, IgE - PFTs - come off trelegy, singulair for a week ideally before PFTs    I spent 46 minutes dedicated to the care of this patient on the date of this encounter to include pre-visit review of records, face-to-face time with the patient discussing conditions above, post visit ordering of testing, clinical documentation with the electronic health record, and communicating necessary findings to members of the patients care team.     Maryjane Hurter, MD [facility] 10/30/2020 3:56 PM

## 2020-10-30 ENCOUNTER — Encounter: Payer: Self-pay | Admitting: Student

## 2020-10-30 ENCOUNTER — Ambulatory Visit (INDEPENDENT_AMBULATORY_CARE_PROVIDER_SITE_OTHER): Payer: BC Managed Care – PPO | Admitting: Student

## 2020-10-30 ENCOUNTER — Other Ambulatory Visit: Payer: Self-pay

## 2020-10-30 VITALS — BP 110/72 | HR 85 | Temp 97.8°F | Ht 62.25 in | Wt 201.4 lb

## 2020-10-30 DIAGNOSIS — R053 Chronic cough: Secondary | ICD-10-CM

## 2020-10-30 DIAGNOSIS — U071 COVID-19: Secondary | ICD-10-CM | POA: Diagnosis not present

## 2020-10-30 MED ORDER — TRELEGY ELLIPTA 200-62.5-25 MCG/INH IN AEPB: 1.0000 | INHALATION_SPRAY | Freq: Every day | RESPIRATORY_TRACT | 6 refills | Status: DC

## 2020-10-30 NOTE — Patient Instructions (Signed)
-   breathing tests in 1-2 weeks. Try to stop trelegy and singulair for at least 2-3 days before breathing tests and ideally 1 week beforehand - labs today - i'll be in touch with results

## 2020-10-31 LAB — CBC WITH DIFFERENTIAL/PLATELET
Basophils Absolute: 0 10*3/uL (ref 0.0–0.1)
Basophils Relative: 0.5 % (ref 0.0–3.0)
Eosinophils Absolute: 0.1 10*3/uL (ref 0.0–0.7)
Eosinophils Relative: 1.8 % (ref 0.0–5.0)
HCT: 40.7 % (ref 36.0–46.0)
Hemoglobin: 13.7 g/dL (ref 12.0–15.0)
Lymphocytes Relative: 20 % (ref 12.0–46.0)
Lymphs Abs: 1.2 10*3/uL (ref 0.7–4.0)
MCHC: 33.8 g/dL (ref 30.0–36.0)
MCV: 90.3 fl (ref 78.0–100.0)
Monocytes Absolute: 0.6 10*3/uL (ref 0.1–1.0)
Monocytes Relative: 8.9 % (ref 3.0–12.0)
Neutro Abs: 4.3 10*3/uL (ref 1.4–7.7)
Neutrophils Relative %: 68.8 % (ref 43.0–77.0)
Platelets: 216 10*3/uL (ref 150.0–400.0)
RBC: 4.51 Mil/uL (ref 3.87–5.11)
RDW: 13.1 % (ref 11.5–15.5)
WBC: 6.2 10*3/uL (ref 4.0–10.5)

## 2020-10-31 LAB — IGE: IgE (Immunoglobulin E), Serum: 19 kU/L (ref <=114)

## 2020-11-17 ENCOUNTER — Other Ambulatory Visit: Payer: Self-pay

## 2020-11-17 ENCOUNTER — Ambulatory Visit (INDEPENDENT_AMBULATORY_CARE_PROVIDER_SITE_OTHER): Payer: BC Managed Care – PPO | Admitting: Student

## 2020-11-17 DIAGNOSIS — R053 Chronic cough: Secondary | ICD-10-CM | POA: Diagnosis not present

## 2020-11-17 LAB — PULMONARY FUNCTION TEST
DL/VA % pred: 127 %
DL/VA: 5.55 ml/min/mmHg/L
DLCO cor % pred: 144 %
DLCO cor: 28.64 ml/min/mmHg
DLCO unc % pred: 145 %
DLCO unc: 28.9 ml/min/mmHg
FEF 25-75 Post: 0.89 L/sec
FEF 25-75 Pre: 1.61 L/sec
FEF2575-%Change-Post: -44 %
FEF2575-%Pred-Post: 33 %
FEF2575-%Pred-Pre: 61 %
FEV1-%Change-Post: -23 %
FEV1-%Pred-Post: 62 %
FEV1-%Pred-Pre: 82 %
FEV1-Post: 1.66 L
FEV1-Pre: 2.17 L
FEV1FVC-%Change-Post: -12 %
FEV1FVC-%Pred-Pre: 79 %
FEV6-%Change-Post: -12 %
FEV6-%Pred-Post: 92 %
FEV6-%Pred-Pre: 105 %
FEV6-Post: 2.99 L
FEV6-Pre: 3.43 L
FEV6FVC-%Change-Post: 0 %
FEV6FVC-%Pred-Post: 102 %
FEV6FVC-%Pred-Pre: 102 %
FVC-%Change-Post: -12 %
FVC-%Pred-Post: 89 %
FVC-%Pred-Pre: 103 %
FVC-Post: 2.99 L
FVC-Pre: 3.43 L
Post FEV1/FVC ratio: 55 %
Post FEV6/FVC ratio: 100 %
Pre FEV1/FVC ratio: 63 %
Pre FEV6/FVC Ratio: 100 %
RV % pred: 109 %
RV: 1.89 L
TLC % pred: 114 %
TLC: 5.49 L

## 2020-11-17 NOTE — Patient Instructions (Signed)
Full PFT performed today. °

## 2020-11-17 NOTE — Progress Notes (Signed)
Full PFT performed today. °

## 2020-11-20 ENCOUNTER — Telehealth: Payer: Self-pay | Admitting: Student

## 2020-11-20 NOTE — Telephone Encounter (Signed)
Called and spoke with patient. She verbalized understanding. She will start back on the Trelegy tomorrow. Advised her to call us if she has any questions between now and her F/U.   Nothing further needed at time of call.

## 2020-11-20 NOTE — Telephone Encounter (Signed)
Can you let her know PFTs look suggestive of asthma (particularly if the pre/post bronchodilator results were accidentally reversed). She should resume her trelegy and then at next office visit we can reassess inhaler choice.  [place]

## 2021-01-12 NOTE — Progress Notes (Signed)
Synopsis: Referred for cough,, asthma by Prince Solian, MD  Subjective:   PATIENT ID: Laura Mcmahon GENDER: female DOB: 1970-12-15, MRN: [id number]  Chief Complaint  Patient presents with   Follow-up    Pt states she is doing better     87yF with history of asthma who is referred for cough, asthma. Had covid-19 in 03/2020.   She says that in late April/May had cough. Dry. Tried GERD medication (she can't remember which) for 30 days ( pepcid in chart). No difference. Then developed wheezing. No chest tightness. While cough is dry, feels like there is something moving up and down back of throat. She has no PND. She sometimes/pretty often has sinonasal congestion. She does think singulair has improved her sinonasal congestion.   Cough stopped in July and then restarted in August.   Did get prescription for steroid in June - this was very helpful for cough, trelegy has been helpful as well.  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 Hard for her to say whther her symptoms are worse at work. No pets or hot tub at home.   No family history of lung disease  Otherwise pertinent review of systems is negative.  Interval HPI: Overall doing better than at last visit. Still using trelegy 1 puff daily but on occasion notes palpitations/discomfort after use including last week. No courses of prednisone/ABX since last visit.   Has some active sinonasal congestion  She does think that she actually felt worse after breathing treatment during PFTs.    No past medical history on file.   Family History  Problem Relation Age of Onset   Asthma Neg Hx    Colon cancer Neg Hx    COPD Neg Hx    Deep vein thrombosis Neg Hx    Cystic fibrosis Neg Hx    Diabetes Neg Hx    Tuberous sclerosis Neg Hx    Prostate cancer Neg Hx    Neurofibromatosis Neg Hx      Past Surgical History:  Procedure Laterality Date   CESAREAN SECTION  N/A 02/08/2006    Social History   Socioeconomic History   Marital status: Married    Spouse name: Not on file   Number of children: Not on file   Years of education: Not on file   Highest education level: Not on file  Occupational History   Not on file  Tobacco Use   Smoking status: Never   Smokeless tobacco: Never  Vaping Use   Vaping Use: Never used  Substance and Sexual Activity   Alcohol use: Not Currently   Drug use: Never   Sexual activity: Not Currently    Partners: Male    Birth control/protection: Post-menopausal  Other Topics Concern   Not on file  Social History Narrative   Not on file   Social Determinants of Health   Financial Resource Strain: Not on file  Food Insecurity: Not on file  Transportation Needs: Not on file  Physical Activity: Not on file  Stress: Not on file  Social Connections: Not on file  Intimate Partner Violence: Not on file     No Known Allergies   Outpatient Medications Prior to Visit  Medication Sig Dispense Refill   Ascorbic Acid (VITAMIN C) 100 MG tablet Take 100 mg by mouth daily.     Multiple Vitamin (MULTIVITAMIN) tablet Take 1 tablet by mouth daily.     TRELEGY ELLIPTA 200-62.5-25 MCG/INH  AEPB Inhale 1 puff into the lungs daily. 1 each 6   albuterol (PROVENTIL) (2.5 MG/3ML) 0.083% nebulizer solution SMARTSIG:1 Vial(s) Via Nebulizer Every 6-8 Hours PRN (Patient not taking: Reported on 01/13/2021)     albuterol (VENTOLIN HFA) 108 (90 Base) MCG/ACT inhaler 2 puff as needed (Patient not taking: Reported on 01/13/2021)     famotidine (PEPCID) 20 MG tablet Take 20 mg by mouth at bedtime as needed. (Patient not taking: Reported on 01/13/2021)     medroxyPROGESTERone (PROVERA) 10 MG tablet Take 1 tablet (10 mg total) by mouth daily for 10 days. Cyclic Provera x 10 days every month if no spontaneous menstrual period. 30 tablet 4   montelukast (SINGULAIR) 10 MG tablet Take 10 mg by mouth daily. (Patient not taking: Reported on 01/13/2021)      No facility-administered medications prior to visit.       Objective:   Physical Exam:  General appearance: 50 y.o., female, NAD, conversant  Eyes: anicteric sclerae; PERRL, tracking appropriately HENT: NCAT; MMM Neck: Trachea midline; no lymphadenopathy, no JVD Lungs: CTAB, no crackles, no wheeze, with normal respiratory effort CV: RRR, no murmur  Abdomen: Soft, non-tender; non-distended, BS present  Extremities: No peripheral edema, warm Skin: Normal turgor and texture; no rash Psych: Appropriate affect Neuro: Alert and oriented to person and place, no focal deficit     Vitals:   01/13/21 1530  BP: 112/74  Pulse: 77  Temp: 97.8 F (36.6 C)  TempSrc: Oral  SpO2: 98%  Weight: 205 lb (93 kg)  Height: 5\' 3"  (1.6 m)    98% on RA BMI Readings from Last 3 Encounters:  01/13/21 36.31 kg/m  10/30/20 36.54 kg/m  10/16/20 36.29 kg/m   Wt Readings from Last 3 Encounters:  01/13/21 205 lb (93 kg)  10/30/20 201 lb 6.4 oz (91.4 kg)  10/16/20 200 lb (90.7 kg)     CBC    Component Value Date/Time   WBC 6.2 10/30/2020 1614   RBC 4.51 10/30/2020 1614   HGB 13.7 10/30/2020 1614   HCT 40.7 10/30/2020 1614   PLT 216.0 10/30/2020 1614   MCV 90.3 10/30/2020 1614   MCHC 33.8 10/30/2020 1614   RDW 13.1 10/30/2020 1614   LYMPHSABS 1.2 10/30/2020 1614   MONOABS 0.6 10/30/2020 1614   EOSABS 0.1 10/30/2020 1614   BASOSABS 0.0 10/30/2020 1614    None available  Chest Imaging: No cxr available for review - per pt's report nothing out of the ordinary this summer  Pulmonary Functions Testing Results: PFT Results Latest Ref Rng & Units 11/17/2020  FVC-Pre L 3.43  FVC-Predicted Pre % 103  FVC-Post L 2.99  FVC-Predicted Post % 89  Pre FEV1/FVC % % 63  Post FEV1/FCV % % 55  FEV1-Pre L 2.17  FEV1-Predicted Pre % 82  FEV1-Post L 1.66  DLCO uncorrected ml/min/mmHg 28.90  DLCO UNC% % 145  DLCO corrected ml/min/mmHg 28.64  DLCO COR %Predicted % 144  DLVA Predicted  % 127  TLC L 5.49  TLC % Predicted % 114  RV % Predicted % 109   Reviewed by me Mild obstruction, post BD PFTs may be inaccurate after discussion with PFT tech, DLCO elevated     Assessment & Plan:   # Chronic cough: # Mild intermittent asthma: Obstruction and elevated DLCO in this patient with steroid responsiveness and good symptomatic response to trelegy increase suspicion for asthma. Does have significant sinonasal congestion which is recurrent issue for her. No overt GERD symptoms but only  had trial of H2B in past it sounds like. No throat tightness/hoarseness suggestive of VCD.  Plan: - CXR today - can come off trelegy - start symbicort 80 2 puffs BID for 3-4 days at a time as needed only when doing worse - SMART inhaler strategy - start singulair 10 mg daily   RTC prn  I spent 37 minutes dedicated to the care of this patient on the date of this encounter to include pre-visit review of records, face-to-face time with the patient discussing conditions above, post visit ordering of testing, clinical documentation with the electronic health record, making appropriate referrals as documented, and communicating necessary findings to members of the patients care team.     Maryjane Hurter, MD [facility] 01/13/2021 4:31 PM

## 2021-01-13 ENCOUNTER — Ambulatory Visit: Payer: BC Managed Care – PPO | Admitting: Student

## 2021-01-13 ENCOUNTER — Encounter: Payer: Self-pay | Admitting: Student

## 2021-01-13 ENCOUNTER — Ambulatory Visit (INDEPENDENT_AMBULATORY_CARE_PROVIDER_SITE_OTHER): Payer: BC Managed Care – PPO

## 2021-01-13 ENCOUNTER — Other Ambulatory Visit: Payer: Self-pay

## 2021-01-13 VITALS — BP 112/74 | HR 77 | Temp 97.8°F | Ht 63.0 in | Wt 205.0 lb

## 2021-01-13 DIAGNOSIS — J4520 Mild intermittent asthma, uncomplicated: Secondary | ICD-10-CM | POA: Diagnosis not present

## 2021-01-13 DIAGNOSIS — R053 Chronic cough: Secondary | ICD-10-CM | POA: Diagnosis not present

## 2021-01-13 MED ORDER — MONTELUKAST SODIUM 10 MG PO TABS: 10.0000 mg | ORAL_TABLET | Freq: Every day | ORAL | 11 refills | Status: DC

## 2021-01-13 MED ORDER — BUDESONIDE-FORMOTEROL FUMARATE 80-4.5 MCG/ACT IN AERO: 2.0000 | INHALATION_SPRAY | Freq: Two times a day (BID) | RESPIRATORY_TRACT | 12 refills | Status: DC

## 2021-01-13 NOTE — Patient Instructions (Addendum)
-   Restart singulair 10 mg daily  - Can stop trelegy - I have prescribed symbicort 80 2 puffs twice daily as needed for 3-4 days at a time when you're feeling worse - If doing worse send a message and send message once you find out which inhaler will be cheapest as below:  Talk to your insurer about which if any of these following inhalers is best covered by your insurance. Let me know and I'd be glad to prescribe it.   LABA/ICS Inhaler Options:  GoodRx: AirDuo Respiclick $31-74 at [facility] Advair 100/250/500 HFA $61/74/133 (publix) at [facility] Symbicort 80/160 is about $90 at variety  AZ&Me: Symbicort can be provided at no cost  [place] patient assistance program: Advair and Breo. Must have spent $600 out-of-pocket on prescriptions this calendar year. If patient hasn't, they would not qualify for the program. Has to have local pharmacy print out how much is spent out of pocket to submit with application.

## 2021-04-28 ENCOUNTER — Other Ambulatory Visit: Payer: Self-pay | Admitting: Obstetrics & Gynecology

## 2021-04-28 DIAGNOSIS — Z1231 Encounter for screening mammogram for malignant neoplasm of breast: Secondary | ICD-10-CM

## 2021-04-29 ENCOUNTER — Ambulatory Visit
Admission: RE | Admit: 2021-04-29 | Discharge: 2021-04-29 | Disposition: A | Discharge status: Home / Self Care | Payer: BC Managed Care – PPO | Source: Ambulatory Visit | Attending: Obstetrics & Gynecology | Admitting: Obstetrics & Gynecology

## 2021-04-29 DIAGNOSIS — Z1231 Encounter for screening mammogram for malignant neoplasm of breast: Secondary | ICD-10-CM

## 2021-08-07 ENCOUNTER — Telehealth: Payer: Self-pay | Admitting: Student

## 2021-08-07 DIAGNOSIS — R911 Solitary pulmonary nodule: Secondary | ICD-10-CM

## 2021-08-07 NOTE — Telephone Encounter (Signed)
Will you reach out to Ms. Nova-Dancausse to let her know I've ordered a CT of her chest? I'd also like to set up a clinic visit with her in a couple weeks to review the results if she's ok with it. X ray at our last visit showed a small nodule - she has a low risk of lung cancer based on her history but out of abundance of caution I have ordered CT Chest. Tried calling and left voicemail.

## 2021-08-10 NOTE — Telephone Encounter (Signed)
Called an spoke with patient. Patient verbalized understanding. Nothing further needed.

## 2021-08-18 ENCOUNTER — Ambulatory Visit
Admission: RE | Admit: 2021-08-18 | Discharge: 2021-08-18 | Disposition: A | Discharge status: Home / Self Care | Payer: BC Managed Care – PPO | Source: Ambulatory Visit | Attending: Student | Admitting: Student

## 2021-08-18 DIAGNOSIS — R911 Solitary pulmonary nodule: Secondary | ICD-10-CM

## 2021-08-24 ENCOUNTER — Telehealth: Payer: Self-pay | Admitting: Student

## 2021-08-24 DIAGNOSIS — R590 Localized enlarged lymph nodes: Secondary | ICD-10-CM

## 2021-08-25 NOTE — Telephone Encounter (Signed)
Called and spoke with patient. She is aware that I will send a message over to Dr. Verlee Monte for the results of her CT scan from 07/11.   Dr. Verlee Monte, can you please advise on her CT scan results? Thanks!

## 2021-08-26 NOTE — Telephone Encounter (Signed)
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 We will repeat ct chest in 6 months to follow anterior mediastinal LAD. Can we schedule clinic visit with me in 7 months?

## 2021-08-26 NOTE — Telephone Encounter (Signed)
Noted  

## 2021-08-26 NOTE — Telephone Encounter (Signed)
Recall placed for 7 months in clinic- routing to [facility] as FYI for CT scan in 6 months.

## 2021-10-20 ENCOUNTER — Ambulatory Visit (INDEPENDENT_AMBULATORY_CARE_PROVIDER_SITE_OTHER): Payer: BC Managed Care – PPO | Admitting: Obstetrics & Gynecology

## 2021-10-20 ENCOUNTER — Encounter: Payer: Self-pay | Admitting: Obstetrics & Gynecology

## 2021-10-20 VITALS — BP 110/74 | HR 91 | Ht 62.25 in | Wt 187.0 lb

## 2021-10-20 DIAGNOSIS — Z01419 Encounter for gynecological examination (general) (routine) without abnormal findings: Secondary | ICD-10-CM

## 2021-10-20 DIAGNOSIS — N951 Menopausal and female climacteric states: Secondary | ICD-10-CM

## 2021-10-20 NOTE — Progress Notes (Signed)
Laura Mcmahon 1970-09-04 [id number]   History:    51 y.o. G1P1L2 Married.  First grade teacher. Twin son/daughter 73 yo.   RP:  Established patient presenting for annual gyn exam    HPI:  Postmenopause on no HRT. No PMB.  Severe hot flushes and night sweats.  No pelvic pain.  No IC as husband has erectile problems, declines contraception.  Pap Neg 10/2019.  No h/o abnormal Pap.  Will repeat Pap at 2 yrs next year.  Urine/BMs normal.  Cologuard 2022. Breasts normal.  Mammo Neg 04/2021.  BMI improved to 33.93.  Continue with low calorie/carb diet. Regular physical activities.  Health labs with Dr Dagmar Hait.    Past medical history,surgical history, family history and social history were all reviewed and documented in the EPIC chart.  Gynecologic History Patient's last menstrual period was 03/18/2019.  Obstetric History OB History  Gravida Para Term Preterm AB Living  '1 1 1     2  '$ SAB IAB Ectopic Multiple Live Births        1      # Outcome Date GA Lbr Len/2nd Weight Sex Delivery Anes PTL Lv  1A Term           1B Term              ROS: A ROS was performed and pertinent positives and negatives are included in the history.  GENERAL: No fevers or chills. HEENT: No change in vision, no earache, sore throat or sinus congestion. NECK: No pain or stiffness. CARDIOVASCULAR: No chest pain or pressure. No palpitations. PULMONARY: No shortness of breath, cough or wheeze. GASTROINTESTINAL: No abdominal pain, nausea, vomiting or diarrhea, melena or bright red blood per rectum. GENITOURINARY: No urinary frequency, urgency, hesitancy or dysuria. MUSCULOSKELETAL: No joint or muscle pain, no back pain, no recent trauma. DERMATOLOGIC: No rash, no itching, no lesions. ENDOCRINE: No polyuria, polydipsia, no heat or cold intolerance. No recent change in weight. HEMATOLOGICAL: No anemia or easy bruising or bleeding. NEUROLOGIC: No headache, seizures, numbness, tingling or weakness. PSYCHIATRIC: No  depression, no loss of interest in normal activity or change in sleep pattern.     Exam:   BP 110/74   Pulse 91   Ht 5' 2.25" (1.581 m)   Wt 187 lb (84.8 kg)   LMP 03/18/2019   SpO2 97%   BMI 33.93 kg/m   Body mass index is 33.93 kg/m.  General appearance : Well developed well nourished female. No acute distress HEENT: Eyes: no retinal hemorrhage or exudates,  Neck supple, trachea midline, no carotid bruits, no thyroidmegaly Lungs: Clear to auscultation, no rhonchi or wheezes, or rib retractions  Heart: Regular rate and rhythm, no murmurs or gallops Breast:Examined in sitting and supine position were symmetrical in appearance, no palpable masses or tenderness,  no skin retraction, no nipple inversion, no nipple discharge, no skin discoloration, no axillary or supraclavicular lymphadenopathy Abdomen: no palpable masses or tenderness, no rebound or guarding Extremities: no edema or skin discoloration or tenderness  Pelvic: Vulva: Normal             Vagina: No gross lesions or discharge  Cervix: No gross lesions or discharge  Uterus  AV, normal size, shape and consistency, non-tender and mobile  Adnexa  Without masses or tenderness  Anus: Normal   Assessment/Plan:  51 y.o. female for annual exam   1. Well female exam with routine gynecological exam Postmenopause on no HRT. No PMB.  Severe hot  flushes and night sweats.  No pelvic pain.  No IC as husband has erectile problems, declines contraception.  Pap Neg 10/2019.  No h/o abnormal Pap.  Will repeat Pap at 2 yrs next year.  Urine/BMs normal.  Cologuard 2022. Breasts normal.  Mammo Neg 04/2021.  BMI improved to 33.93.  Continue with low calorie/carb diet. Regular physical activities.  Health labs with Dr Dagmar Hait.   2. Postmenopausal syndrome  Postmenopause on no HRT. No PMB.  Severe hot flushes and night sweats.  No pelvic pain.  No IC as husband has erectile problems, declines contraception. Will try Ashwagandha.  If not enough to  control her hot flushes and night sweats, will schedule a visit to consider HRT.  [name] MD, 3:40 PM 10/20/2021

## 2022-02-16 ENCOUNTER — Other Ambulatory Visit: Payer: BC Managed Care – PPO

## 2022-03-15 ENCOUNTER — Other Ambulatory Visit: Payer: Self-pay | Admitting: Student

## 2022-10-22 ENCOUNTER — Ambulatory Visit: Payer: BC Managed Care – PPO | Admitting: Obstetrics & Gynecology

## 2023-02-10 ENCOUNTER — Other Ambulatory Visit: Payer: Self-pay | Admitting: Obstetrics and Gynecology

## 2023-02-10 DIAGNOSIS — Z1231 Encounter for screening mammogram for malignant neoplasm of breast: Secondary | ICD-10-CM

## 2023-02-11 ENCOUNTER — Ambulatory Visit
Admission: RE | Admit: 2023-02-11 | Discharge: 2023-02-11 | Disposition: A | Discharge status: Home / Self Care | Payer: 59 | Source: Ambulatory Visit | Attending: Obstetrics and Gynecology

## 2023-02-11 DIAGNOSIS — Z1231 Encounter for screening mammogram for malignant neoplasm of breast: Secondary | ICD-10-CM

## 2023-09-16 ENCOUNTER — Ambulatory Visit: Payer: Self-pay

## 2023-09-16 NOTE — Telephone Encounter (Signed)
 FYI Only or Action Required?: FYI only for provider. - Advised call PCP or go to UC for appt today  Patient is followed in Pulmonology for chronic cough, last seen on 01/13/2021 by Gladis Leonor HERO, MD. - TOC previously scheduled for 9/30 with Dr. Theophilus Fortis Nurse Triage reporting Wheezing, worsening cough, Dizziness, chest tightness with coughing, SOB with coughing and exertion, and chest congestion.  Symptoms began about a month ago.  Interventions attempted: Maintenance inhaler.  Symptoms are: gradually worsening.  Triage Disposition: See HCP Within 4 Hours (Or PCP Triage)  Patient/caregiver understands and will follow disposition?: Yes     CRM did not go through. Agent reporting pt having SOB, wheezing, chest tightness.  Reason for Disposition  [1] MILD difficulty breathing (e.g., minimal/no SOB at rest, SOB with walking, pulse < 100) AND [2] NEW-onset or WORSE than normal  Answer Assessment - Initial Assessment Questions E2C2 Pulmonary Triage - Initial Assessment Questions Chief Complaint (e.g., cough, sob, wheezing, fever, chills, sweat or additional symptoms) *Go to specific symptom protocol after initial questions. Worse SOB than usual just with coughing, not constant, not really having SOB when not coughing Wheezing most of the time, goes away for little bit but after hour from taking breo feel it again Chest tightness, mainly because of the coughing, feels like congested in there, not actually pain-pain more like a tightness, hurts when cough a lot, not constant, comes and goes When cough most is when feel phlegm in chest, feel tightness and real SOB Making me cough a lot, voice kind of raspy, lot of coughing and lot of phlegm coming Breo seems to be controlling it, taking it for over year, but worsening symptoms for past month, symbicort  worked pretty well Some days this week feeling lightheaded Phlegm for most part very clear but some very very light brown, beige,  no red yellow black coming up Even if walking or going up stairs get more SOB lately SOB more with coughing Speaking in full sentences  How long have symptoms been present? past month or so been getting little bit worse and worse  Have you used your inhalers/maintenance medication? Yes If yes, What medications? Use inhaler in morning does help Recently changed from symbicort  to breo ellipta 1-1.5 years ago Breo x1/day usually in morning No rescue inhaler  OXYGEN: Do you wear supplemental oxygen? No  Do you monitor your oxygen levels? Yes If yes, What is your reading (oxygen level) today? 96%  What is your usual oxygen saturation reading?  (Note: Pulmonary O2 sats should be 90% or greater) 97-98%  6. CARDIAC HISTORY: Do you have any history of heart disease? (e.g., heart attack, angina, bypass surgery, angioplasty)      No hx of blood clots in legs or lungs, denies cardiac hx  7. LUNG HISTORY: Do you have any history of lung disease?  (e.g., pulmonary embolus, asthma, emphysema)     Chronic cough 2022 seen Dr. Gladis, stopped for a while, 2023-2025 pretty decent but for past few months cough been progressing, was not constant but lately been getting worse  9. OTHER SYMPTOMS: Do you have any other symptoms? (e.g., chest pain, cough, dizziness, fever, runny nose)     BP fine 101/68, first lightheadedness Monday or Tuesday and felt like about to pass out though sitting down but went away after several minutes, then next day around same time, not severe but not had any since then No fever   Advised pt be examined in next 4  hours, no pulm availability, agent scheduled TOC to Dr. Theophilus for September, last seen by Dr. Gladis December 2022. Advised pt call PCP for appt today, otherwise go to UC today. Advised call back if any worsening or new symptoms, go to ED if SOB even at rest or worsening chest pain, dizziness worsens again.  Protocols used: Breathing  Difficulty-A-AH

## 2023-09-18 IMAGING — MG MM DIGITAL SCREENING BILAT W/ TOMO AND CAD
8 series · 9 of 24 positions shown · non-contrast
Comparison: Previous exam(s).

CLINICAL DATA: Screening.

EXAM:
DIGITAL SCREENING BILATERAL MAMMOGRAM WITH TOMOSYNTHESIS AND CAD
TECHNIQUE: Bilateral screening digital craniocaudal and mediolateral oblique
mammograms were obtained. Bilateral screening digital breast
tomosynthesis was performed. The images were evaluated with
computer-aided detection.

[L CC synth-2D]
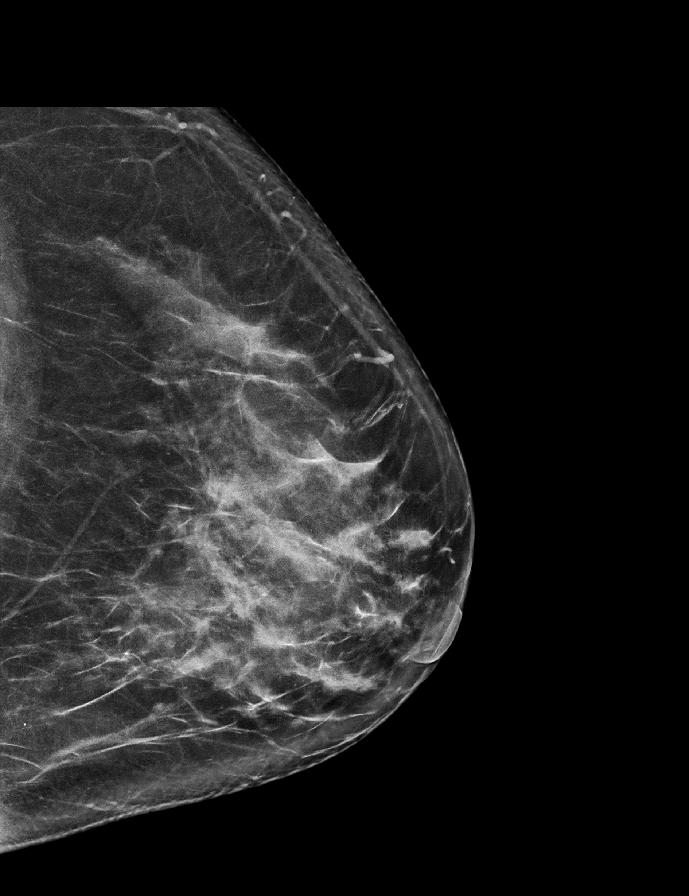

[R CC synth-2D]
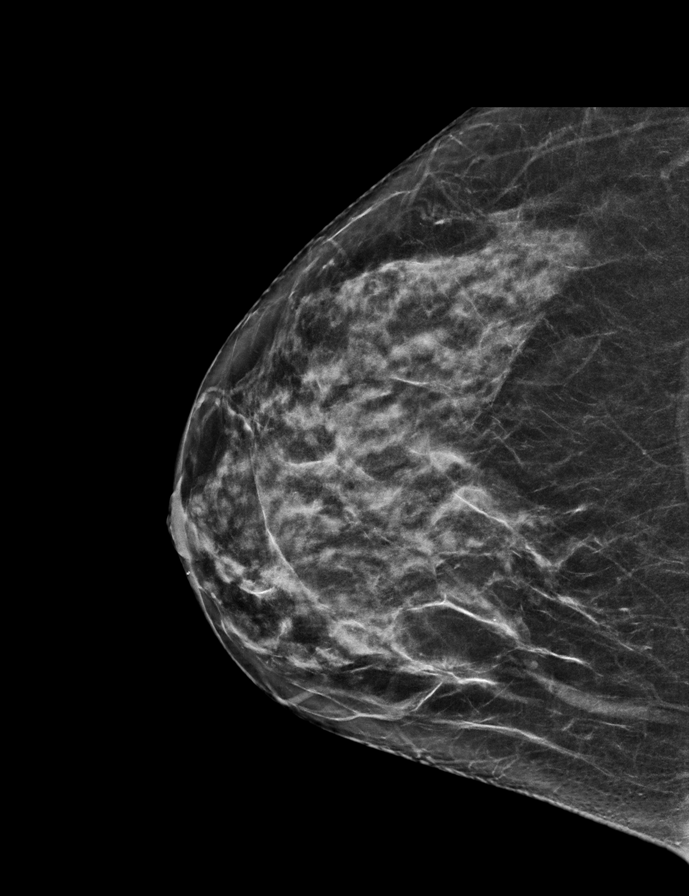

[R MLO synth-2D]
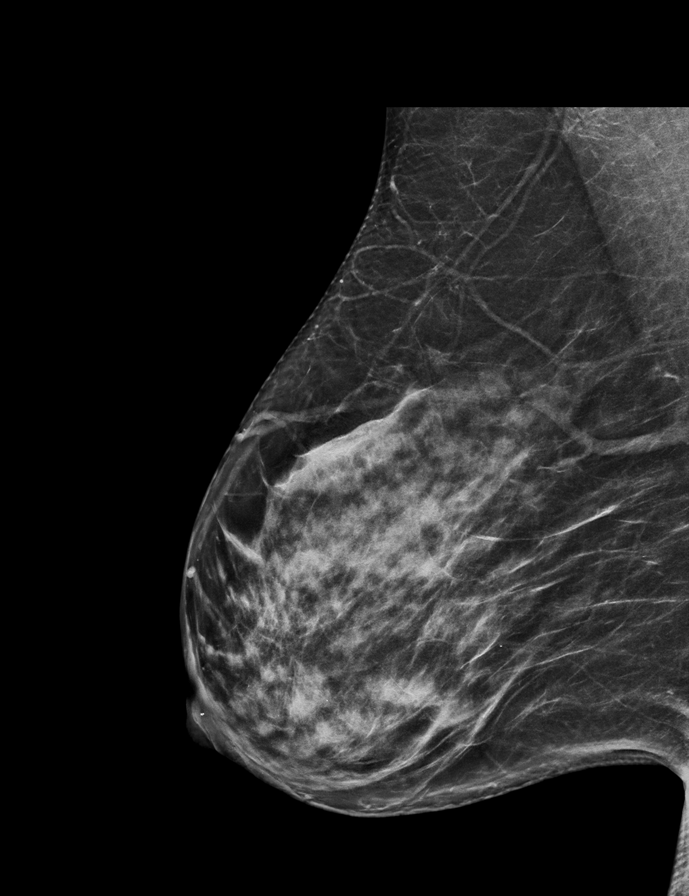

[L MLO synth-2D]
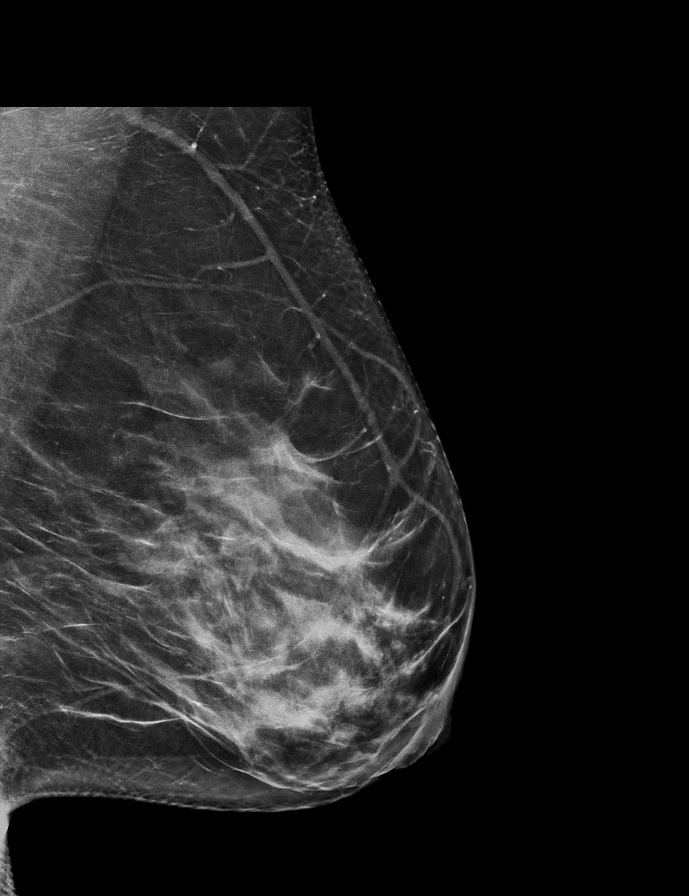

[L MLO tomo · 2 of 73 frames shown]
[frame 24/73]
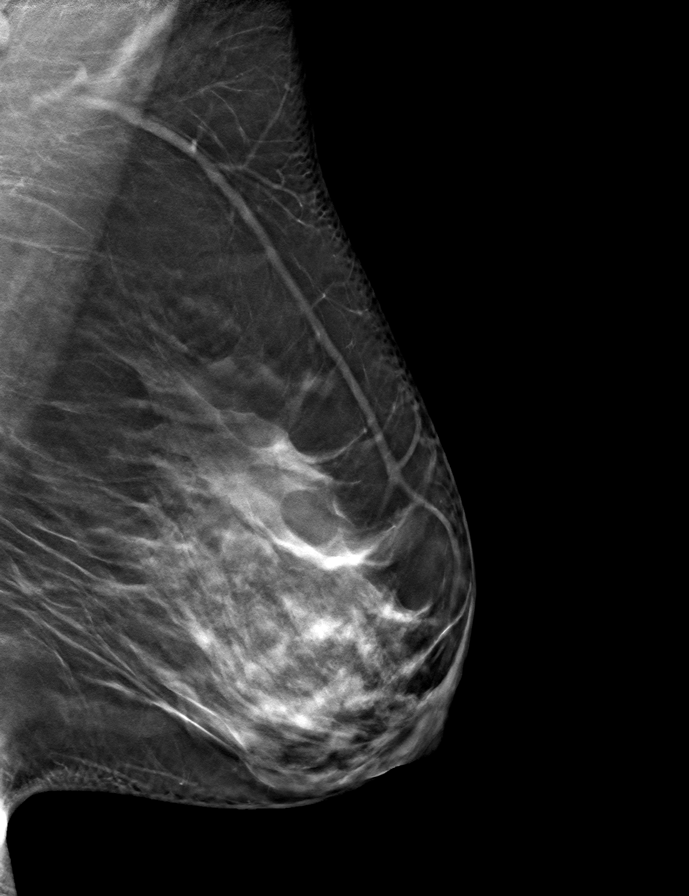
[frame 37/73]
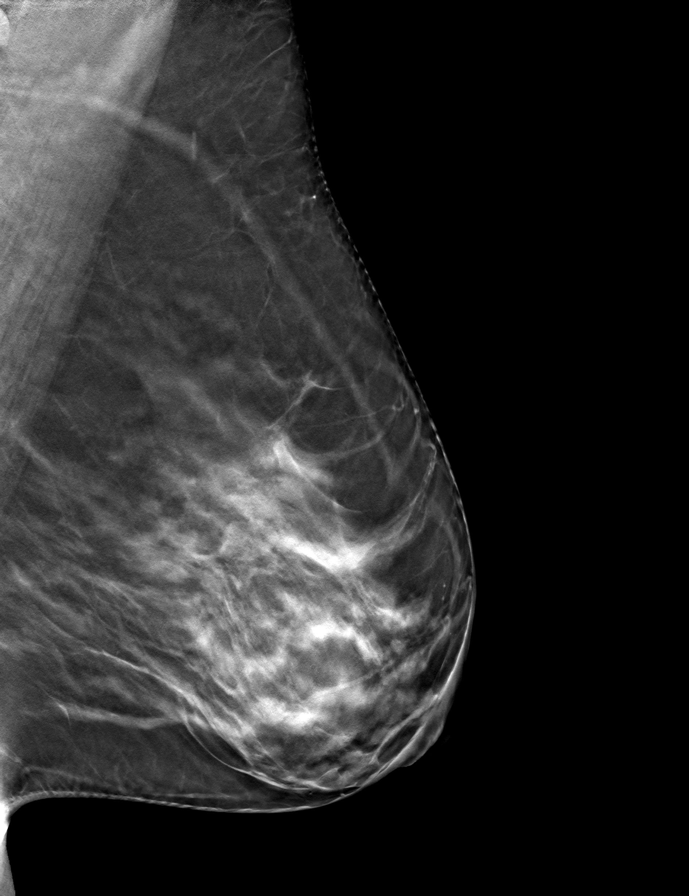

[R MLO tomo · tomo slice 34/67.0]
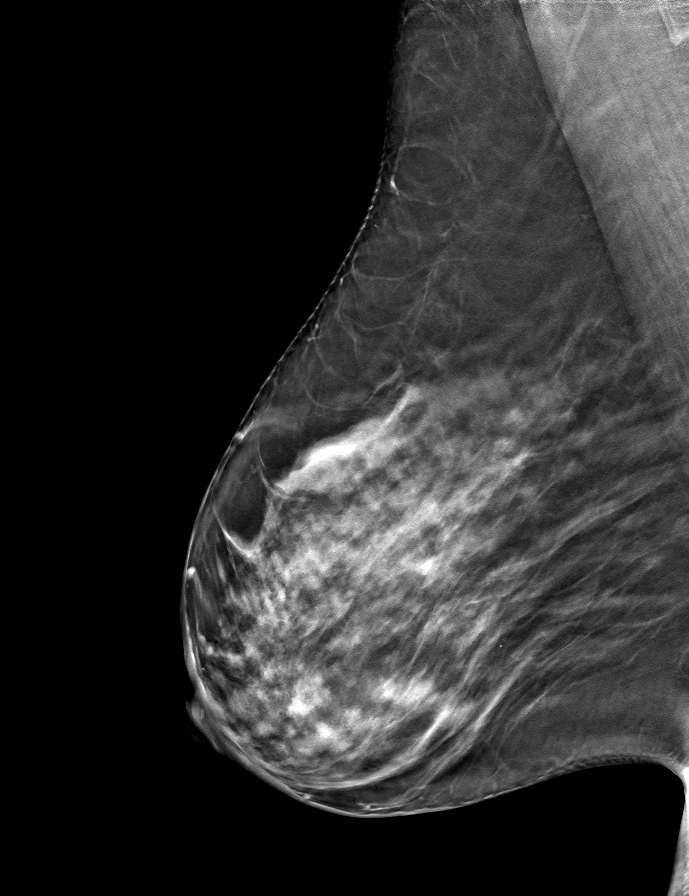

[L CC tomo · tomo slice 35/70.0]
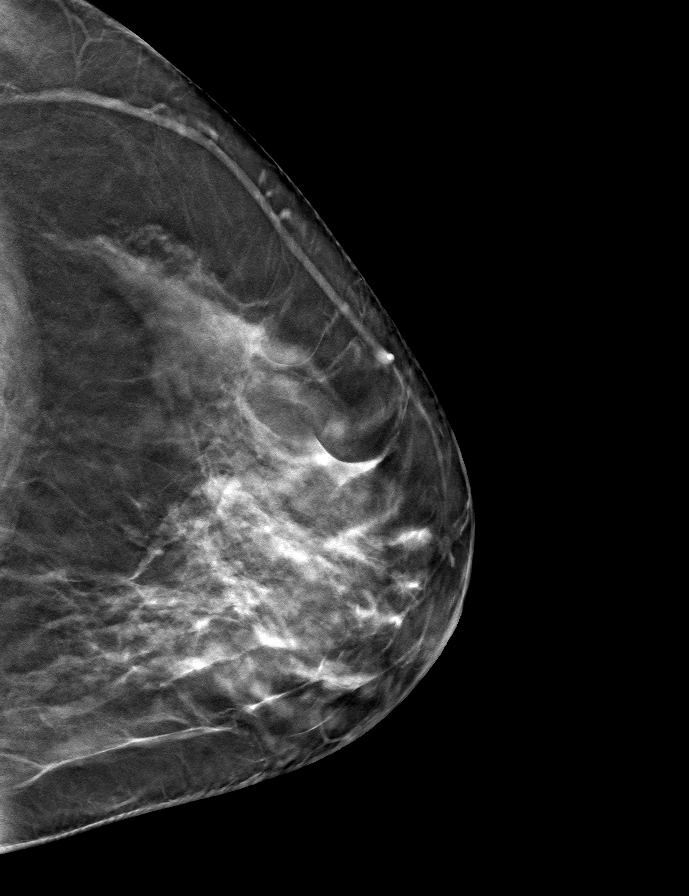

[R CC tomo · tomo slice 31/61.0]
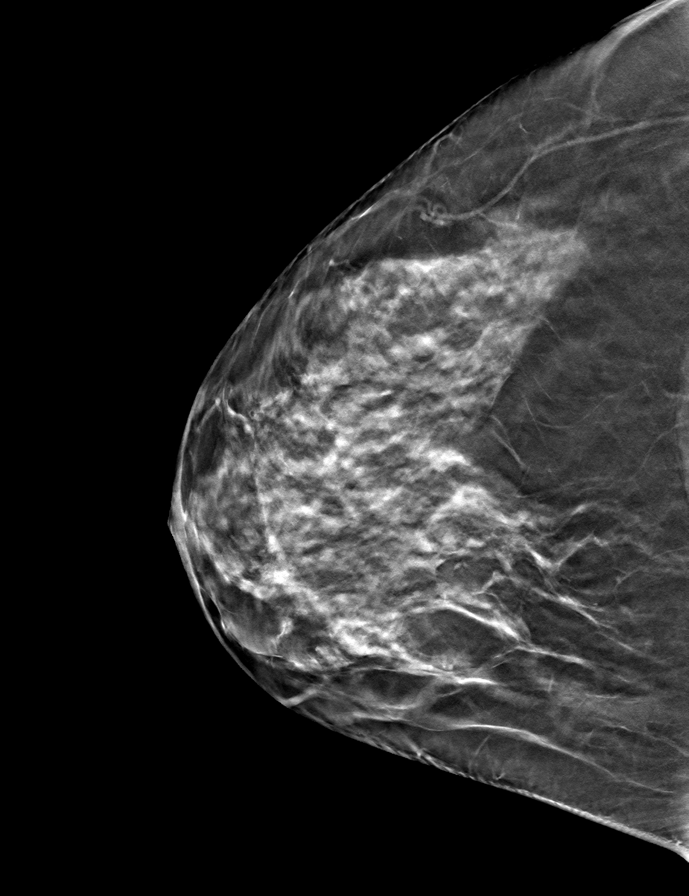

[9 of 24 positions shown; findings below may reference images not displayed]

ACR Breast Density Category c: The breast tissue is heterogeneously
dense, which may obscure small masses.
FINDINGS: There are no findings suspicious for malignancy.
IMPRESSION: No mammographic evidence of malignancy. A result letter of this
screening mammogram will be mailed directly to the patient.

RECOMMENDATION:
Screening mammogram in one year. (Code:[id number]-BC3)

BI-RADS CATEGORY  1: Negative.

## 2023-11-08 ENCOUNTER — Ambulatory Visit: Admitting: Pulmonary Disease

## 2023-11-08 ENCOUNTER — Encounter: Payer: Self-pay | Admitting: Pulmonary Disease

## 2023-11-08 VITALS — BP 110/82 | HR 64 | Temp 98.4°F | Ht 63.0 in | Wt 204.0 lb

## 2023-11-08 DIAGNOSIS — J4551 Severe persistent asthma with (acute) exacerbation: Secondary | ICD-10-CM | POA: Diagnosis not present

## 2023-11-08 LAB — POCT EXHALED NITRIC OXIDE: FeNO level (ppb): 38

## 2023-11-08 MED ORDER — TRELEGY ELLIPTA 200-62.5-25 MCG/ACT IN AEPB: 1.0000 | INHALATION_SPRAY | Freq: Every day | RESPIRATORY_TRACT | 3 refills | Status: AC

## 2023-11-08 NOTE — Patient Instructions (Signed)
  VISIT SUMMARY: You visited today due to worsening asthma symptoms, including a persistent cough, chest tightness, and wheezing. We discussed your current medications and environmental factors that may be contributing to your symptoms.  YOUR PLAN: ASTHMA: Your asthma has been worsening, especially in your work environment, which may have poor air quality. Your symptoms improve during vacations. -Start using Trelegy inhaler to help control your asthma symptoms better. -Continue taking Singulair  (montelukast ) daily. -Use Airsupra as needed for quick relief of symptoms. -We will do blood tests to check for allergic inflammation. -You will have a lung function test. -We will get the report of your recent chest x-ray from your previous provider. -Follow up in 3 months to see how you are doing and to consider additional treatments if needed. -Get an RSV vaccination.                      Contains text generated by [employer].                                 Contains text generated by [employer]bridge.

## 2023-11-08 NOTE — Progress Notes (Unsigned)
 Laura Mcmahon    [id number]    Oct 02, 1970  Primary Care Physician:Laura Mcmahon, [place], MD  Referring Physician: Avva, Ravisankar, MD [address] [place],  [place] [address]  Chief complaint: Consult for asthma  HPI: 53 y.o. who  has a past medical history of Asthma.  Discussed the use of AI scribe software for clinical note transcription with the patient, who gave verbal consent to proceed.  History of Present Illness Laura Mcmahon is a 53 year old female with asthma who presents with worsening respiratory symptoms. She is accompanied by her husband, Laura Mcmahon.  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  No feather pillows or comforters No h/o chemo/XRT/amiodarone/macrodantin/MTX  No exposure to asbestos, silica or other organic allergens   Smoking history: Never smoker Travel history: Originally from [place].  Previously lived in [place]  Family history: Brother had asthma  Outpatient Encounter Medications as of 11/08/2023  Medication Sig   AIRSUPRA 90-80 MCG/ACT AERO Inhale 2 puffs into the lungs every 6 (six) hours as needed.   albuterol (VENTOLIN HFA) 108 (90 Base) MCG/ACT inhaler Inhale 2 puffs into the lungs every 4 (four) hours as needed.   Ascorbic Acid (VITAMIN C) 100 MG tablet Take 100 mg by mouth daily.   benzonatate (TESSALON) 100 MG capsule Take 200 mg by mouth every 8 (eight) hours as needed.   BREO ELLIPTA 100-25 MCG/ACT AEPB Inhale 1 puff into the lungs daily.   montelukast  (SINGULAIR ) 10 MG tablet Take 1 tablet (10 mg total) by mouth at bedtime.   Multiple Vitamin (MULTIVITAMIN) tablet Take 1 tablet by mouth daily.   budesonide -formoterol  (SYMBICORT ) 80-4.5 MCG/ACT inhaler Inhale 2 puffs into the lungs in the morning and at bedtime. (Patient not taking: Reported on 11/08/2023)   No facility-administered encounter medications on file as of 11/08/2023.     Physical Exam: Today's Vitals   11/08/23 1542  BP: 110/82  Pulse: 64  Temp: 98.4 F (36.9 C)  TempSrc: Oral  SpO2: 97%  Weight: 204 lb (92.5 kg)  Height: 5' 3 (1.6 m)   Body mass index is 36.14 kg/m.  Physical Exam GEN: No acute distress. CV: Regular rate and rhythm, no murmurs. LUNGS: Occasional wheeze heard, otherwise clear to auscultation bilaterally with normal respiratory effort. SKIN JOINTS: Warm and dry, no rash.   Data Reviewed: Imaging: CT chest 08/18/2021-benign calcified granuloma in the left upper lobe.  8 mm soft tissue nodule in anterior mediastinum.  Lungs are clear.  I  have reviewed the images personally.  PFTs: 11/17/2020 FVC 2.99 [89%], FEV1 1.66 [62%], F/F 55, TLC 5.49 [114%], DLCO 28.90 [145%] Moderate obstructive airway disease, increased diffusion capacity  FENO 11/08/2023-38  Labs: CBC 10/30/2020-WBC 6.2, eos  1.8%, absolute eosinophil count 112  Assessment and Plan Assessment & Plan Asthma with exacerbation Chronic asthma with recent exacerbations characterized by severe cough, chest tightness, and wheezing. Symptoms worsen in school environment, possibly due to poor air quality. Improved symptoms during summer vacation and while out of the country. Current medications include Breo Ellipta, Singulair , and Airsupra. Recent use of oral steroids with partial relief. Occasional wheezing on examination. Recent chest x-ray clear.  Elevated FeNO indicates increased eosinophilic airway inflammation - Prescribe Trelegy inhaler instead of Breo to optimize controller medication with an additional bronchodilator component. - Continue Singulair  (montelukast ). - Continue Airsupra as needed for rescue. - Order blood tests [CBC with differential, IgE] to check for allergic inflammation. - Perform lung function test. - Obtain recent chest x-ray report from previous provider.  She was told that lungs are clear. - Follow up in 3 months to assess control and consider biologic injection therapy if indicated by blood test results. - Recommend RSV and flu, COVID vaccination.  Recommendations: Start Trelegy and stop Breo Continue Singulair , Airsupra Check CBC with differential, IgE, PFTs  Lonna Coder MD [facility] 11/08/2023, 4:03 PM  CC: Laura Mcmahon, Ravisankar, MD

## 2023-11-09 ENCOUNTER — Other Ambulatory Visit: Payer: Self-pay | Admitting: Pulmonary Disease

## 2023-11-09 LAB — CBC WITH DIFFERENTIAL/PLATELET
Basophils Absolute: 0 K/uL (ref 0.0–0.1)
Basophils Relative: 0.2 % (ref 0.0–3.0)
Eosinophils Absolute: 0.4 K/uL (ref 0.0–0.7)
Eosinophils Relative: 7.7 % — ABNORMAL HIGH (ref 0.0–5.0)
HCT: 42.4 % (ref 36.0–46.0)
Hemoglobin: 14.2 g/dL (ref 12.0–15.0)
Lymphocytes Relative: 22.1 % (ref 12.0–46.0)
Lymphs Abs: 1.3 K/uL (ref 0.7–4.0)
MCHC: 33.4 g/dL (ref 30.0–36.0)
MCV: 88.1 fl (ref 78.0–100.0)
Monocytes Absolute: 0.6 K/uL (ref 0.1–1.0)
Monocytes Relative: 10.1 % (ref 3.0–12.0)
Neutro Abs: 3.4 K/uL (ref 1.4–7.7)
Neutrophils Relative %: 59.9 % (ref 43.0–77.0)
Platelets: 239 K/uL (ref 150.0–400.0)
RBC: 4.82 Mil/uL (ref 3.87–5.11)
RDW: 13 % (ref 11.5–15.5)
WBC: 5.7 K/uL (ref 4.0–10.5)

## 2023-11-09 LAB — IGE: IgE (Immunoglobulin E), Serum: 136 kU/L — ABNORMAL HIGH (ref <=114)

## 2023-11-19 ENCOUNTER — Ambulatory Visit: Payer: Self-pay | Admitting: Pulmonary Disease

## 2024-01-23 ENCOUNTER — Encounter

## 2024-01-24 ENCOUNTER — Ambulatory Visit: Admitting: Pulmonary Disease

## 2024-01-24 ENCOUNTER — Encounter: Payer: Self-pay | Admitting: Pulmonary Disease

## 2024-01-24 DIAGNOSIS — J4551 Severe persistent asthma with (acute) exacerbation: Secondary | ICD-10-CM

## 2024-01-24 NOTE — Patient Instructions (Signed)
°  VISIT SUMMARY: During your visit, we discussed your asthma symptoms and noted significant improvement with your current treatment plan. You are now using your rescue inhaler less frequently, and your controller medication has been effective at a higher dose.  YOUR PLAN: SEVERE PERSISTENT ASTHMA WITH ACUTE EXACERBATION: Your asthma symptoms have improved significantly with the current treatment regimen. Cold weather still exacerbates your symptoms, causing coughing. Allergy testing showed no specific allergies, but irritants are identified as triggers. -Continue using the Trelegy inhaler at 200 mcg. -Continue using Airsupra as your rescue inhaler. -Ensure you have refills for Trelegy and montelukast . -We will review the results of your rescheduled breathing test once they are available.

## 2024-01-24 NOTE — Progress Notes (Signed)
 Laura Mcmahon    [id number]    26-Dec-1970  Primary Care Physician:Avva, [place], MD  Referring Physician: Lakedra [place], MD [address] [place],  [place] [address]  Chief complaint: Follow-up for asthma  HPI: 53 y.o. who  has a past medical history of Asthma.  Discussed the use of AI scribe software for clinical note transcription with the patient, who gave verbal consent to proceed.  History of Present Illness  Laura Mcmahon is a 53 year old female with asthma who presents for follow-up of her respiratory symptoms.  Asthma symptoms and control - Significant improvement in breathing since last visit - Current use of rescue inhaler Quentin) is once or twice per week, previously used daily - Cold weather exacerbates symptoms and increases coughing  Controller and adjunctive therapy - Controller medication changed from Breo to Trelegy at higher dose (100 to 200), resulting in marked symptom improvement - Continues montelukast  therapy, uncertain if refill is needed  Environmental triggers and allergies - Allergy specialist evaluation in November found no allergies - Reacts to irritants, which contribute to respiratory symptoms  Relevant Pulmonary history: Pets: No pets Occupation: First grade teacher Exposures: No mold, hot tub, Jacuzzi.  No feather pillows or comforters No h/o chemo/XRT/amiodarone/macrodantin/MTX  No exposure to asbestos, silica or other organic allergens  Smoking history: Never smoker Travel history: Originally from [place].  Previously lived in [place]  Family history: Brother had asthma  Outpatient Encounter Medications as of 01/24/2024  Medication Sig   AIRSUPRA 90-80 MCG/ACT AERO Inhale 2 puffs into the lungs every 6 (six) hours as needed.   Ascorbic Acid (VITAMIN C) 100 MG tablet Take 100 mg by mouth daily.   benzonatate (TESSALON) 100 MG capsule Take 200 mg by mouth every 8 (eight) hours as  needed.   Fluticasone-Umeclidin-Vilant (TRELEGY ELLIPTA ) 200-62.5-25 MCG/ACT AEPB Inhale 1 puff into the lungs daily.   montelukast  (SINGULAIR ) 10 MG tablet TAKE 1 TABLET EVERY EVENING   Multiple Vitamin (MULTIVITAMIN) tablet Take 1 tablet by mouth daily.   albuterol (VENTOLIN HFA) 108 (90 Base) MCG/ACT inhaler Inhale 2 puffs into the lungs every 4 (four) hours as needed. (Patient not taking: Reported on 01/24/2024)   No facility-administered encounter medications on file as of 01/24/2024.     Physical Exam: Today's Vitals   01/24/24 1606  BP: 105/72  Pulse: 84  Temp: 98.4 F (36.9 C)  TempSrc: Oral  SpO2: 95%  Weight: 211 lb (95.7 kg)  Height: 5' 3 (1.6 m)   Body mass index is 37.38 kg/m.  Physical Exam GEN: No acute distress. CV: Regular rate and rhythm, no murmurs. LUNGS: Clear to auscultation bilaterally, normal respiratory effort. SKIN JOINTS: Warm and dry, no rash.   Data Reviewed: Imaging: CT chest 08/18/2021-benign calcified granuloma in the left upper lobe.  8 mm soft tissue nodule in anterior mediastinum.  Lungs are clear.   I have reviewed the images personally.  PFTs: 11/17/2020 FVC 2.99 [89%], FEV1 1.66 [62%], F/F 55, TLC 5.49 [114%], DLCO 28.90 [145%] Moderate obstructive airway disease, increased diffusion capacity  FENO 11/08/2023-38  Labs: CBC  10/30/2020-WBC 6.2, eos 1.8%, absolute eosinophil count 112 11/08/2023-WBC 5.7, eos 7.7%, absolute eosinophil count 439  IgE 11/08/2023-136 Assessment & Plan Severe persistent asthma with acute exacerbation Asthma symptoms have improved significantly with the current treatment regimen. She reports using the rescue inhaler Quentin) only once or twice a week, a reduction from daily use. Cold weather exacerbates  symptoms, causing coughing. Allergy testing in November showed no specific allergies, but irritants are identified as triggers. Trelegy inhaler has been effective, and the dosage increase to 200 mcg has  made a significant difference. Valaria is preferred over albuterol due to its steroid component, providing better relief.  No need for Biologics at present. - Continue Trelegy inhaler at 200 mcg. - Continue Airsupra as rescue inhaler. - Ensure refills for Trelegy and montelukast  are available. - Await results of the rescheduled breathing test and will review findings.  Recommendations: Continue Trelegy, Singulair , Airsupra PFTs  Lonna Coder MD [facility] 01/24/2024, 4:21 PM  CC: Taija Mathias, MD

## 2024-01-26 ENCOUNTER — Ambulatory Visit

## 2024-01-26 ENCOUNTER — Other Ambulatory Visit: Payer: Self-pay

## 2024-01-26 DIAGNOSIS — J45901 Unspecified asthma with (acute) exacerbation: Secondary | ICD-10-CM

## 2024-01-26 LAB — PULMONARY FUNCTION TEST
DL/VA % pred: 114 %
DL/VA: 4.94 ml/min/mmHg/L
DLCO unc % pred: 91 %
DLCO unc: 17.94 ml/min/mmHg
FEF 25-75 Post: 0.2 L/s
FEF 25-75 Pre: 0.86 L/s
FEF2575-%Change-Post: -76 %
FEF2575-%Pred-Post: 8 %
FEF2575-%Pred-Pre: 34 %
FEV1-%Change-Post: -23 %
FEV1-%Pred-Post: 47 %
FEV1-%Pred-Pre: 62 %
FEV1-Post: 1.22 L
FEV1-Pre: 1.59 L
FEV1FVC-%Change-Post: 0 %
FEV1FVC-%Pred-Pre: 75 %
FEV6-%Change-Post: -26 %
FEV6-%Pred-Post: 61 %
FEV6-%Pred-Pre: 82 %
FEV6-Post: 1.93 L
FEV6-Pre: 2.62 L
FEV6FVC-%Change-Post: -1 %
FEV6FVC-%Pred-Post: 100 %
FEV6FVC-%Pred-Pre: 101 %
FVC-%Change-Post: -23 %
FVC-%Pred-Post: 62 %
FVC-%Pred-Pre: 81 %
FVC-Post: 2.03 L
FVC-Pre: 2.65 L
Post FEV1/FVC ratio: 60 %
Post FEV6/FVC ratio: 97 %
Pre FEV1/FVC ratio: 60 %
Pre FEV6/FVC Ratio: 99 %
RV % pred: 122 %
RV: 2.18 L
TLC % pred: 99 %
TLC: 4.77 L

## 2024-01-26 NOTE — Patient Instructions (Signed)
 Full pft performed today

## 2024-01-26 NOTE — Progress Notes (Signed)
 Full pft performed today

## 2024-02-01 ENCOUNTER — Ambulatory Visit: Payer: Self-pay | Admitting: Pulmonary Disease
